# Patient Record
Sex: Male | Born: 1995 | Race: White | Hispanic: No | Marital: Single | Smoking: Never smoker
Health system: Southern US, Community
[De-identification: ages and names within clinical notes are randomized; demographics above are authoritative.]

## PROBLEM LIST (undated history)

## (undated) DIAGNOSIS — F840 Autistic disorder: Secondary | ICD-10-CM

## (undated) DIAGNOSIS — J302 Other seasonal allergic rhinitis: Secondary | ICD-10-CM

---
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
## 2014-07-14 ENCOUNTER — Ambulatory Visit: Payer: Self-pay | Admitting: Surgery

## 2015-05-10 ENCOUNTER — Encounter (HOSPITAL_COMMUNITY): Payer: Self-pay | Admitting: *Deleted

## 2015-05-10 ENCOUNTER — Emergency Department (HOSPITAL_COMMUNITY): Payer: Self-pay

## 2015-05-10 ENCOUNTER — Emergency Department (HOSPITAL_COMMUNITY)
Admission: EM | Admit: 2015-05-10 | Discharge: 2015-05-11 | Disposition: A | Discharge status: Home / Self Care | Payer: Self-pay | Attending: Emergency Medicine | Admitting: Emergency Medicine

## 2015-05-10 DIAGNOSIS — F840 Autistic disorder: Secondary | ICD-10-CM | POA: Insufficient documentation

## 2015-05-10 DIAGNOSIS — R002 Palpitations: Secondary | ICD-10-CM | POA: Insufficient documentation

## 2015-05-10 DIAGNOSIS — Z79899 Other long term (current) drug therapy: Secondary | ICD-10-CM | POA: Insufficient documentation

## 2015-05-10 HISTORY — DX: Gilbert syndrome: E80.4

## 2015-05-10 HISTORY — DX: Autistic disorder: F84.0

## 2015-05-10 HISTORY — DX: Other seasonal allergic rhinitis: J30.2

## 2015-05-10 LAB — BASIC METABOLIC PANEL
ANION GAP: 9 (ref 5–15)
BUN: 12 mg/dL (ref 6–20)
CO2: 27 mmol/L (ref 22–32)
Calcium: 9.3 mg/dL (ref 8.9–10.3)
Chloride: 106 mmol/L (ref 101–111)
Creatinine, Ser: 0.79 mg/dL (ref 0.61–1.24)
GFR calc Af Amer: >60 mL/min (ref >60)
GFR calc non Af Amer: >60 mL/min (ref >60)
GLUCOSE: 106 mg/dL — AB (ref 65–99)
POTASSIUM: 3.3 mmol/L — AB (ref 3.5–5.1)
Sodium: 142 mmol/L (ref 135–145)

## 2015-05-10 LAB — CBC WITH DIFFERENTIAL/PLATELET
BASOS ABS: 0 10*3/uL (ref 0.0–0.1)
Basophils Relative: 1 %
Eosinophils Absolute: 0.1 10*3/uL (ref 0.0–0.7)
Eosinophils Relative: 1 %
HEMATOCRIT: 39.8 % (ref 39.0–52.0)
Hemoglobin: 13.5 g/dL (ref 13.0–17.0)
LYMPHS PCT: 30 %
Lymphs Abs: 2 10*3/uL (ref 0.7–4.0)
MCH: 29.9 pg (ref 26.0–34.0)
MCHC: 33.9 g/dL (ref 30.0–36.0)
MCV: 88.2 fL (ref 78.0–100.0)
MONO ABS: 0.6 10*3/uL (ref 0.1–1.0)
Monocytes Relative: 10 %
NEUTROS ABS: 3.8 10*3/uL (ref 1.7–7.7)
Neutrophils Relative %: 58 %
Platelets: 232 10*3/uL (ref 150–400)
RBC: 4.51 MIL/uL (ref 4.22–5.81)
RDW: 12.5 % (ref 11.5–15.5)
WBC: 6.5 10*3/uL (ref 4.0–10.5)

## 2015-05-10 LAB — I-STAT TROPONIN, ED: Troponin i, poc: 0 ng/mL (ref 0.00–0.08)

## 2015-05-10 IMAGING — CR DG CHEST 2V
2 series · 2 of 2 positions shown · non-contrast
Comparison: [DATE] chest radiograph.

CLINICAL DATA: Chest pain

EXAM:
CHEST  2 VIEW

[chest pa]
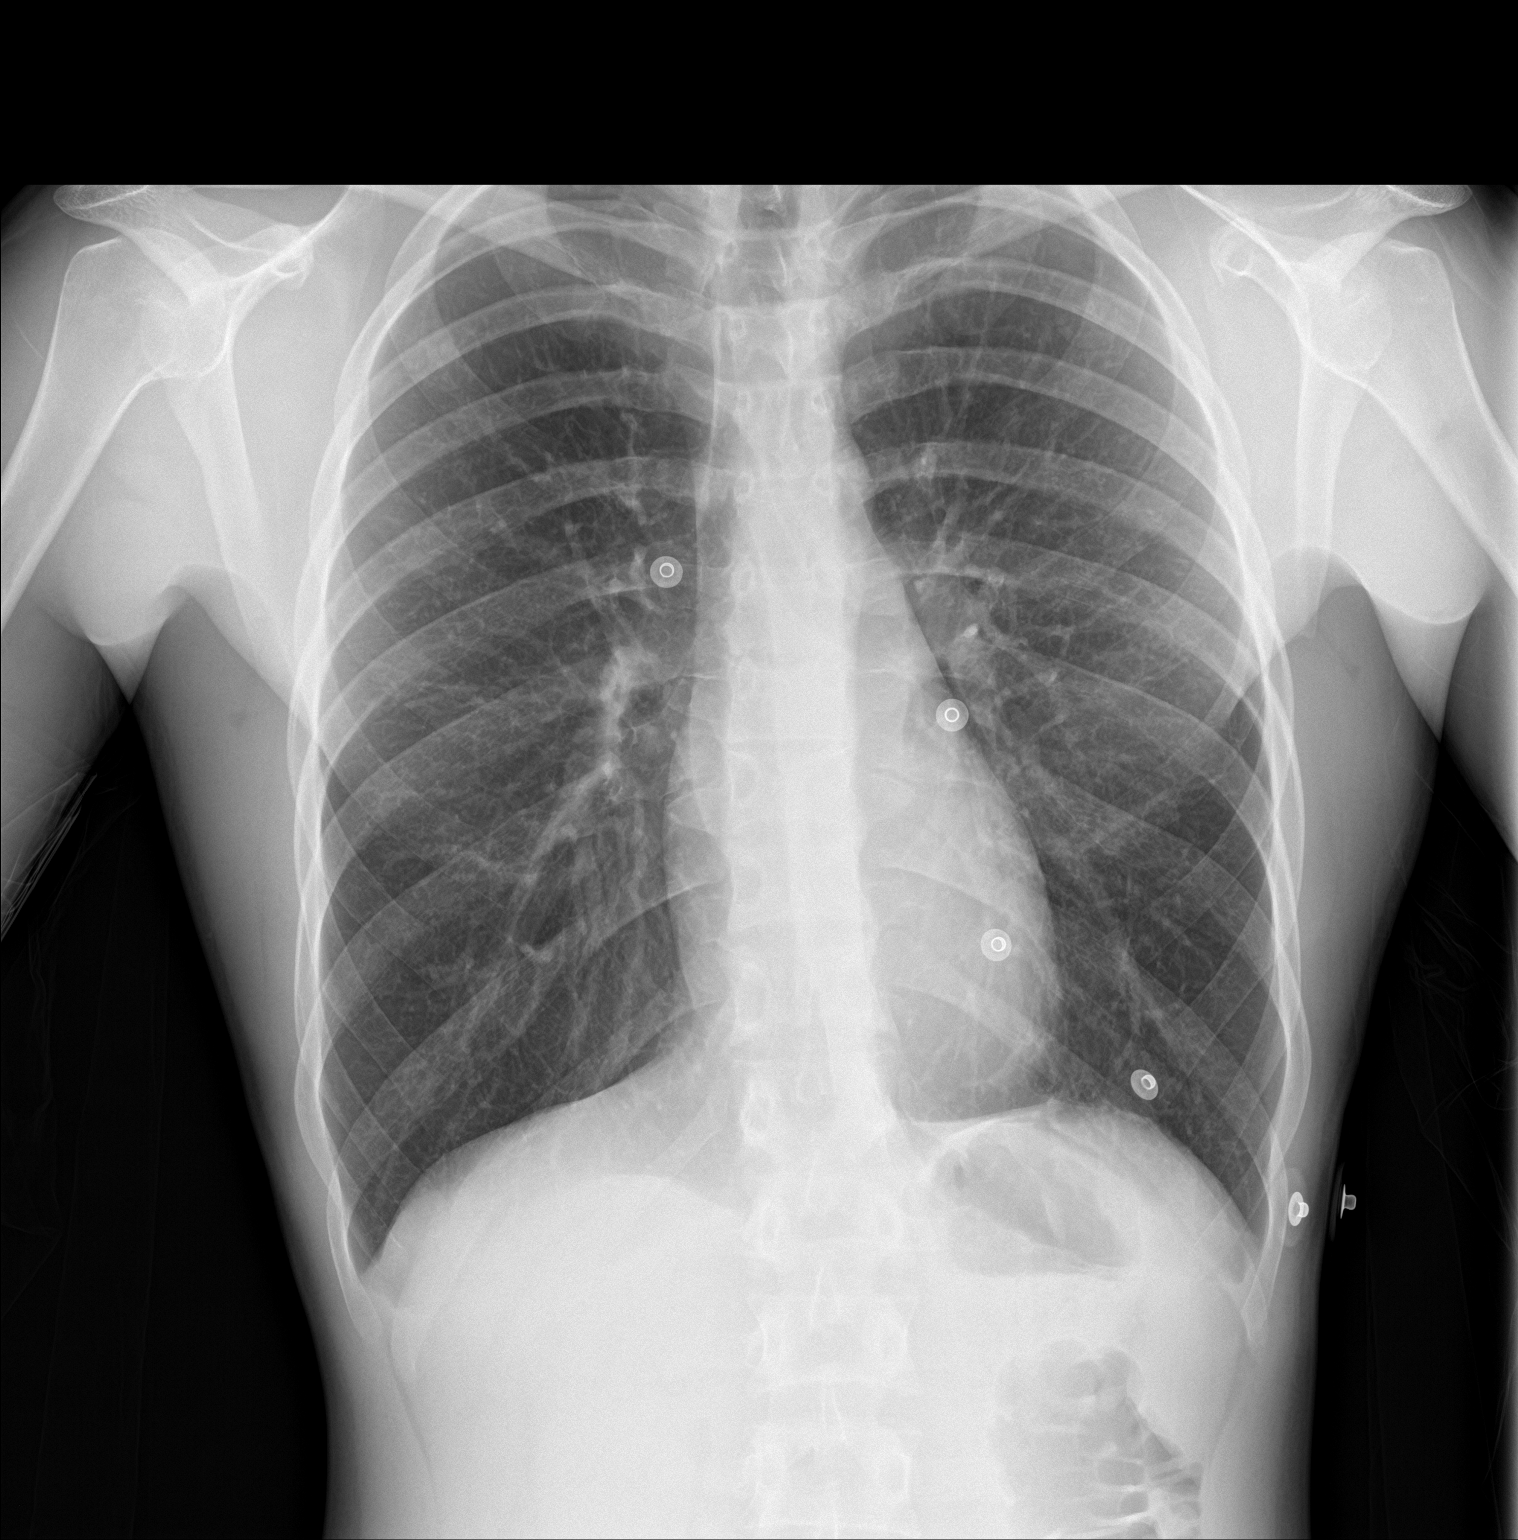

[chest lat]
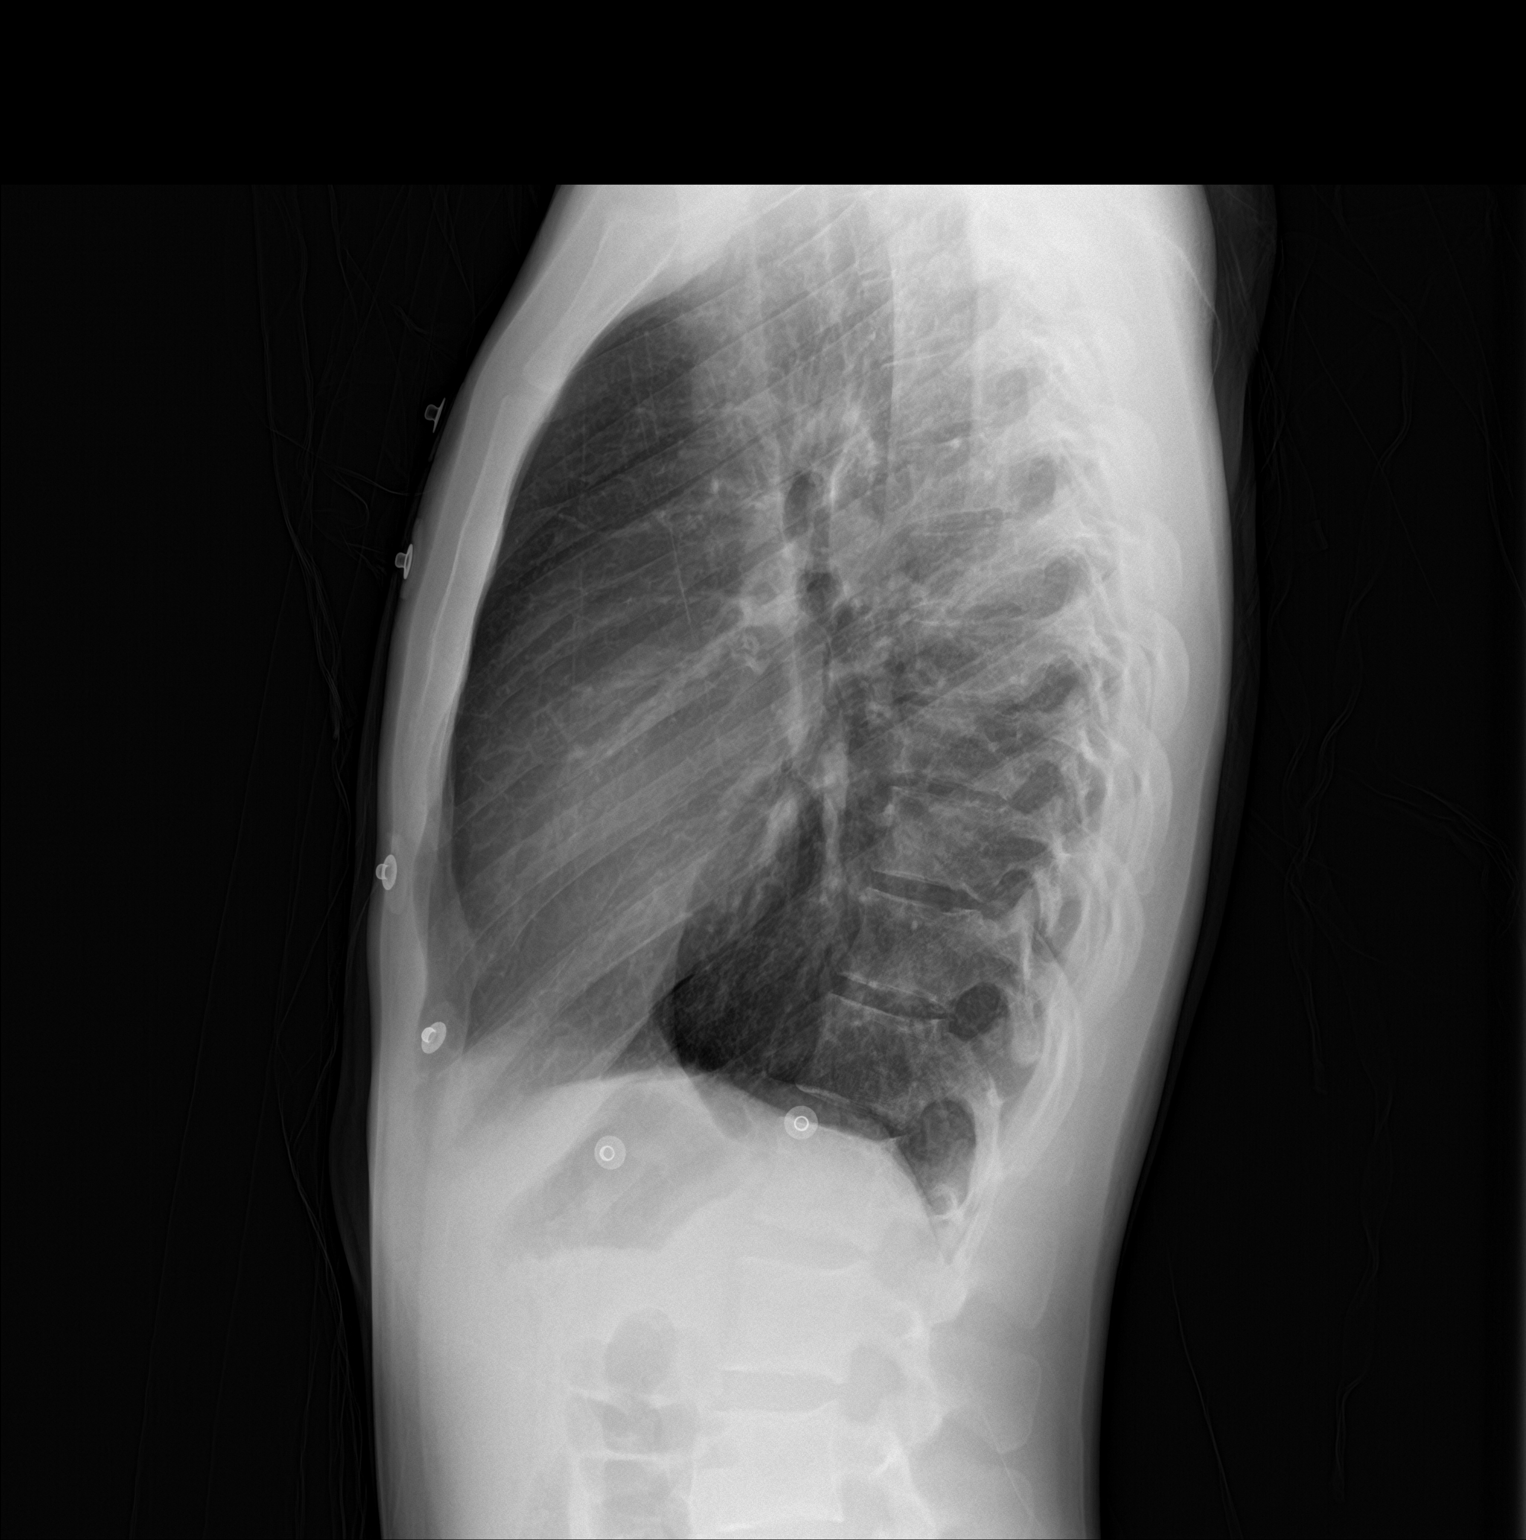

[2 of 2 positions shown; findings below may reference images not displayed]

FINDINGS: Stable cardiomediastinal silhouette with normal heart size. No
pneumothorax. No pleural effusion. Lungs appear clear, with no acute
consolidative airspace disease and no pulmonary edema.
IMPRESSION: No active cardiopulmonary disease.

## 2015-05-10 IMAGING — CT CT CTA ABD/PEL W/CM AND/OR W/O CM
1 of 10 series · 1 of 36 positions shown · IV contrast (Iohexol (Omnipaque 350))
Comparison: CT of the abdomen and pelvis performed [DATE], and
chest radiograph earlier today at [DATE] p.m.

CLINICAL DATA: Acute onset of central chest pain. Tachycardia and
left arm numbness. Initial encounter.

EXAM:
CT ANGIOGRAPHY CHEST, ABDOMEN AND PELVIS
TECHNIQUE: Multidetector CT imaging through the chest, abdomen and pelvis was
performed using the standard protocol during bolus administration of
intravenous contrast. Multiplanar reconstructed images and MIPs were
obtained and reviewed to evaluate the vascular anatomy.
CONTRAST:  100mL OMNIPAQUE IOHEXOL 350 MG/ML SOLN

[Series 300: locator · axial · 0.68mm/px · 1 of 1 slices shown]
[im 1/1  lung]
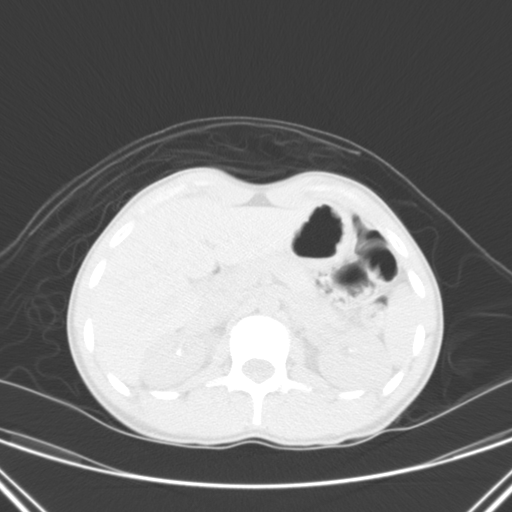

[1 of 36 positions shown; findings below may reference images not displayed]

FINDINGS: CTA CHEST FINDINGS

There is no evidence of aortic dissection. There is no evidence of
aneurysmal dilatation. The great vessels are grossly unremarkable in
appearance.

There is no evidence of pulmonary embolus.

The lungs are clear bilaterally. There is no evidence of significant
focal consolidation, pleural effusion or pneumothorax. No masses are
identified; no abnormal focal contrast enhancement is seen.

The mediastinum is unremarkable in appearance. No mediastinal
lymphadenopathy is seen. No pericardial effusion is identified. The
great vessels are grossly unremarkable in appearance. No axillary
lymphadenopathy is seen. The visualized portions of the thyroid
gland are unremarkable in appearance.

No acute osseous abnormalities are seen.

Review of the MIP images confirms the above findings.

CTA ABDOMEN AND PELVIS FINDINGS

There is no evidence of aortic dissection. There is no evidence of
aneurysmal dilatation. No calcific atherosclerotic disease is seen.

The liver and spleen are unremarkable in appearance. The gallbladder
is within normal limits. The pancreas and adrenal glands are
unremarkable.

The kidneys are unremarkable in appearance. There is no evidence of
hydronephrosis. No renal or ureteral stones are seen. No perinephric
stranding is appreciated.

No free fluid is identified. The small bowel is unremarkable in
appearance. The stomach is within normal limits. No acute vascular
abnormalities are seen.

The appendix is normal in caliber, without evidence of appendicitis.
The colon is unremarkable in appearance.

The bladder is moderately distended and grossly unremarkable. The
prostate remains normal in size. No inguinal lymphadenopathy is
seen.

No acute osseous abnormalities are identified.

Review of the MIP images confirms the above findings.
IMPRESSION: 1. No evidence of aortic dissection. No evidence of aneurysmal
dilatation. No calcific atherosclerotic disease seen.
2. No evidence of pulmonary embolus.
3. Lungs clear bilaterally.
4. No acute abnormality seen within the abdomen or pelvis.

## 2015-05-10 MED ORDER — IOHEXOL 350 MG/ML SOLN
100.0000 mL | Freq: Once | INTRAVENOUS | Status: AC | PRN
  Administered 2015-05-10: 100 mL via INTRAVENOUS

## 2015-05-10 NOTE — ED Notes (Signed)
[facility] getting [facility] chest pain. States that it was sharp and felt like his heart was racing, his left arm also went numb during the event. States he has had chest pain in the past that he usually just "stretches it out and it goes away" but today it didn't go away. Pt told his mom who checked his heart rate and said it was in the 140's so she called EMS. EMS reports NSR upon their arrival with no other symptoms. Pt states that his mom gave him 5 baby aspirins and the pain and numbness went away.

## 2015-05-10 NOTE — ED Provider Notes (Signed)
CSN: [id number]     Arrival date & time 05/10/15  2000 History   First MD Initiated Contact with Patient 05/10/15 2001     Chief Complaint  Patient presents with  . Chest Pain     (Consider location/radiation/quality/duration/timing/severity/associated sxs/prior Treatment) HPI   Patient with PMH of Gilbert's disease, Autism, Seasonal allergies. He has been having on again, off again, chest pains for the past few months. Stretching typically makes the pains go away. While he was in [facility] today he developed the chest pain that lasted approximately 15 minutes. He went home to tell his mother who gave him 5 baby aspirins and checked his pulse and said its 140. Per EMS, on arrival he was in normal sinus rhythm. Pt had associated numbness to his left arm. Denies hx of surgeries, heart disease, family heart disease. Denies any recent trauma or fall. Denies SOB, coughing, ear pain, sore throat, nasal congestion.  10: 15 pm- Mom presented late. She offers more information. She is concerned because his older brother at the patients age had severe CP like [place] and was diagnosed with aneurysm to his aortic notch which is now close to 5 cm. She is concerned that this son has a similar condition. She reports at home he became pale, with purple/blue around his eyes, and diaphoretic which she admits all resolved by the time EMS arrived on scene.  Past Medical History  Diagnosis Date  . Gilbert's disease   . Autism   . Seasonal allergies    History reviewed. No pertinent past surgical history. No family history on file. Social History  Substance Use Topics  . Smoking status: Never Smoker   . Smokeless tobacco: Current User    Types: Chew     Comment: 1 - 2 times weekly  . Alcohol Use: No    Review of Systems  Review of Systems All other systems negative except as documented in the HPI. All pertinent positives and negatives as reviewed in the HPI.  Allergies  Review of patient's allergies  indicates no known allergies.  Home Medications   Prior to Admission medications   Medication Sig Start Date End Date Taking? Authorizing Provider  Ascorbic Acid (VITAMIN C PO) Take 1 tablet by mouth daily.    Yes Historical Provider, MD  esomeprazole (NEXIUM) 40 MG capsule Take 40 mg by mouth daily at 12 noon.   Yes Historical Provider, MD  Multiple Vitamin (MULTIVITAMIN WITH MINERALS) TABS tablet Take 1 tablet by mouth daily.   Yes Historical Provider, MD   BP 127/73 mmHg  Pulse 75  Temp(Src) 98.7 F (37.1 C) (Oral)  Resp 19  SpO2 99% Physical Exam  Constitutional: He appears well-developed and well-nourished. No distress.  HENT:  Head: Normocephalic and atraumatic.  Right Ear: Tympanic membrane and ear canal normal.  Left Ear: Tympanic membrane and ear canal normal.  Nose: Nose normal.  Mouth/Throat: Uvula is midline, oropharynx is clear and moist and mucous membranes are normal.  Eyes: Pupils are equal, round, and reactive to light.  Neck: Normal range of motion. Neck supple.  Cardiovascular: Normal rate and regular rhythm.   Pulmonary/Chest: Effort normal.  Abdominal: Soft.  No signs of abdominal distention  Musculoskeletal:  No LE swelling  Neurological: He is alert.  Acting at baseline  Skin: Skin is warm and dry. No rash noted.  Nursing note and vitals reviewed.   ED Course  Procedures (including critical care time) Labs Review Labs Reviewed  BASIC METABOLIC PANEL -  Abnormal; Notable for the following:    Potassium 3.3 (*)    Glucose, Bld 106 (*)    All other components within normal limits  CBC WITH DIFFERENTIAL/PLATELET  Rosezena Sensor, ED    Imaging Review Dg Chest 2 View  05/10/2015  CLINICAL DATA:  Chest pain EXAM: CHEST  2 VIEW COMPARISON:  01/10/2010 chest radiograph. FINDINGS: Stable cardiomediastinal silhouette with normal heart size. No pneumothorax. No pleural effusion. Lungs appear clear, with no acute consolidative airspace disease and no  pulmonary edema. IMPRESSION: No active cardiopulmonary disease. Electronically Signed   By: Delbert Phenix M.D.   On: 05/10/2015 21:00   Ct Angio Chest Aorta W/cm &/or Wo/cm  05/11/2015  CLINICAL DATA:  Acute onset of central chest pain. Tachycardia and left arm numbness. Initial encounter. EXAM: CT ANGIOGRAPHY CHEST, ABDOMEN AND PELVIS TECHNIQUE: Multidetector CT imaging through the chest, abdomen and pelvis was performed using the standard protocol during bolus administration of intravenous contrast. Multiplanar reconstructed images and MIPs were obtained and reviewed to evaluate the vascular anatomy. CONTRAST:  OMNIPAQUE IOHEXOL 350 MG/ML SOLN COMPARISON:  CT of the abdomen and pelvis performed 08/10/2006, and chest radiograph earlier today at 8:39 p.m. FINDINGS: CTA CHEST FINDINGS There is no evidence of aortic dissection. There is no evidence of aneurysmal dilatation. The great vessels are grossly unremarkable in appearance. There is no evidence of pulmonary embolus. The lungs are clear bilaterally. There is no evidence of significant focal consolidation, pleural effusion or pneumothorax. No masses are identified; no abnormal focal contrast enhancement is seen. The mediastinum is unremarkable in appearance. No mediastinal lymphadenopathy is seen. No pericardial effusion is identified. The great vessels are grossly unremarkable in appearance. No axillary lymphadenopathy is seen. The visualized portions of the thyroid gland are unremarkable in appearance. No acute osseous abnormalities are seen. Review of the MIP images confirms the above findings. CTA ABDOMEN AND PELVIS FINDINGS There is no evidence of aortic dissection. There is no evidence of aneurysmal dilatation. No calcific atherosclerotic disease is seen. The liver and spleen are unremarkable in appearance. The gallbladder is within normal limits. The pancreas and adrenal glands are unremarkable. The kidneys are unremarkable in appearance. There is  no evidence of hydronephrosis. No renal or ureteral stones are seen. No perinephric stranding is appreciated. No free fluid is identified. The small bowel is unremarkable in appearance. The stomach is within normal limits. No acute vascular abnormalities are seen. The appendix is normal in caliber, without evidence of appendicitis. The colon is unremarkable in appearance. The bladder is moderately distended and grossly unremarkable. The prostate remains normal in size. No inguinal lymphadenopathy is seen. No acute osseous abnormalities are identified. Review of the MIP images confirms the above findings. IMPRESSION: 1. No evidence of aortic dissection. No evidence of aneurysmal dilatation. No calcific atherosclerotic disease seen. 2. No evidence of pulmonary embolus. 3. Lungs clear bilaterally. 4. No acute abnormality seen within the abdomen or pelvis. Electronically Signed   By: Roanna Raider M.D.   On: 05/11/2015 00:35   Ct Cta Abd/pel W/cm &/or W/o Cm  05/11/2015  CLINICAL DATA:  Acute onset of central chest pain. Tachycardia and left arm numbness. Initial encounter. EXAM: CT ANGIOGRAPHY CHEST, ABDOMEN AND PELVIS TECHNIQUE: Multidetector CT imaging through the chest, abdomen and pelvis was performed using the standard protocol during bolus administration of intravenous contrast. Multiplanar reconstructed images and MIPs were obtained and reviewed to evaluate the vascular anatomy. CONTRAST:  OMNIPAQUE IOHEXOL 350 MG/ML SOLN COMPARISON:  CT of  the abdomen and pelvis performed 08/10/2006, and chest radiograph earlier today at 8:39 p.m. FINDINGS: CTA CHEST FINDINGS There is no evidence of aortic dissection. There is no evidence of aneurysmal dilatation. The great vessels are grossly unremarkable in appearance. There is no evidence of pulmonary embolus. The lungs are clear bilaterally. There is no evidence of significant focal consolidation, pleural effusion or pneumothorax. No masses are identified; no  abnormal focal contrast enhancement is seen. The mediastinum is unremarkable in appearance. No mediastinal lymphadenopathy is seen. No pericardial effusion is identified. The great vessels are grossly unremarkable in appearance. No axillary lymphadenopathy is seen. The visualized portions of the thyroid gland are unremarkable in appearance. No acute osseous abnormalities are seen. Review of the MIP images confirms the above findings. CTA ABDOMEN AND PELVIS FINDINGS There is no evidence of aortic dissection. There is no evidence of aneurysmal dilatation. No calcific atherosclerotic disease is seen. The liver and spleen are unremarkable in appearance. The gallbladder is within normal limits. The pancreas and adrenal glands are unremarkable. The kidneys are unremarkable in appearance. There is no evidence of hydronephrosis. No renal or ureteral stones are seen. No perinephric stranding is appreciated. No free fluid is identified. The small bowel is unremarkable in appearance. The stomach is within normal limits. No acute vascular abnormalities are seen. The appendix is normal in caliber, without evidence of appendicitis. The colon is unremarkable in appearance. The bladder is moderately distended and grossly unremarkable. The prostate remains normal in size. No inguinal lymphadenopathy is seen. No acute osseous abnormalities are identified. Review of the MIP images confirms the above findings. IMPRESSION: 1. No evidence of aortic dissection. No evidence of aneurysmal dilatation. No calcific atherosclerotic disease seen. 2. No evidence of pulmonary embolus. 3. Lungs clear bilaterally. 4. No acute abnormality seen within the abdomen or pelvis. Electronically Signed   By: Roanna RaiderJeffery  Chang M.D.   On: 05/11/2015 00:35   I have personally reviewed and evaluated these images and lab results as part of my medical decision-making.   EKG Interpretation   Date/Time:  Tuesday May 10 2015 20:02:16 EDT Ventricular Rate:   83 PR Interval:  141 QRS Duration: 97 QT Interval:  369 QTC Calculation: 434 R Axis:   76 Text Interpretation:  Sinus rhythm RSR' in V1 or V2, right VCD or RVH ST  elevation, consider inferior injury No old tracing to compare Confirmed by  [facility]  MD, ELLIOTT [phone]) on 05/10/2015 10:00:20 PM      MDM   Final diagnoses:  Palpitations    Shouldn't likely had some sort of tachycardia arrhythmia that was self-limiting. It was not witnessed during his stay in the emergency department. Due to family history of aneurysm, the mom requested this be evaluated due to concern for genetic predisposition. The patient's workup, including CT angiogram chest and abdomen, chest x-ray, troponin and blood work all returned unremarkable. He'll be referred to cardiology for further evaluation but at this time the patient is safe for discharge and has been pain free and asymptomatic during his stay in the ER   Marlon Peliffany Adrina Armijo, PA-C 05/11/15 0046  Mancel BaleElliott Wentz, MD 05/12/15 [phone]

## 2015-05-11 NOTE — Discharge Instructions (Signed)
Palpitations  A palpitation is the feeling that your heartbeat is irregular or is faster than normal. It may feel like your heart is fluttering or skipping a beat. Palpitations are usually not a serious problem. However, in some cases, you may need further medical evaluation.  CAUSES   Palpitations can be caused by:  · Smoking.  · Caffeine or other stimulants, such as diet pills or energy drinks.  · Alcohol.  · Stress and anxiety.  · Strenuous physical activity.  · Fatigue.  · Certain medicines.  · Heart disease, especially if you have a history of irregular heart rhythms (arrhythmias), such as atrial fibrillation, atrial flutter, or supraventricular tachycardia.  · An improperly working pacemaker or defibrillator.  DIAGNOSIS   To find the cause of your palpitations, your health care provider will take your medical history and perform a physical exam. Your health care provider may also have you take a test called an ambulatory electrocardiogram (ECG). An ECG records your heartbeat patterns over a 24-hour period. You may also have other tests, such as:  · Transthoracic echocardiogram (TTE). During echocardiography, sound waves are used to evaluate how blood flows through your heart.  · Transesophageal echocardiogram (TEE).  · Cardiac monitoring. This allows your health care provider to monitor your heart rate and rhythm in real time.  · Holter monitor. This is a portable device that records your heartbeat and can help diagnose heart arrhythmias. It allows your health care provider to track your heart activity for several days, if needed.  · Stress tests by exercise or by giving medicine that makes the heart beat faster.  TREATMENT   Treatment of palpitations depends on the cause of your symptoms and can vary greatly. Most cases of palpitations do not require any treatment other than time, relaxation, and monitoring your symptoms. Other causes, such as atrial fibrillation, atrial flutter, or supraventricular  tachycardia, usually require further treatment.  HOME CARE INSTRUCTIONS   · Avoid:    Caffeinated coffee, tea, soft drinks, diet pills, and energy drinks.    Chocolate.    Alcohol.  · Stop smoking if you smoke.  · Reduce your stress and anxiety. Things that can help you relax include:    A method of controlling things in your body, such as your heartbeats, with your mind (biofeedback).    Yoga.    Meditation.    Physical activity such as swimming, jogging, or walking.  · Get plenty of rest and sleep.  SEEK MEDICAL CARE IF:   · You continue to have a fast or irregular heartbeat beyond 24 hours.  · Your palpitations occur more often.  SEEK IMMEDIATE MEDICAL CARE IF:  · You have chest pain or shortness of breath.  · You have a severe headache.  · You feel dizzy or you faint.  MAKE SURE YOU:  · Understand these instructions.  · Will watch your condition.  · Will get help right away if you are not doing well or get worse.     This information is not intended to replace advice given to you by your health care provider. Make sure you discuss any questions you have with your health care provider.     Document Released: 01/27/2000 Document Revised: 02/03/2013 Document Reviewed: 03/30/2011  Elsevier Interactive Patient Education ©2016 [employer]      Holter Monitoring  A Holter monitor is a small device that is used to detect abnormal heart rhythms. It clips to your clothing and is connected by wires   to flat, sticky disks (electrodes) that attach to your chest. It is worn continuously for 24-48 hours.  HOME CARE INSTRUCTIONS  · Wear your Holter monitor at all times, even while exercising and sleeping, for as long as directed by your health care provider.  · Make sure that the Holter monitor is safely clipped to your clothing or close to your body as recommended by your health care provider.  · Do not get the monitor or wires wet.  · Do not put body lotion or moisturizer on your chest.  · Keep your skin clean.  · Keep a  diary of your daily activities, such as walking and doing chores. If you feel that your heartbeat is abnormal or that your heart is fluttering or skipping a beat:    Record what you are doing when it happens.    Record what time of day the symptoms occur.  · Return your Holter monitor as directed by your health care provider.  · Keep all follow-up visits as directed by your health care provider. This is important.  SEEK IMMEDIATE MEDICAL CARE IF:  · You feel lightheaded or you faint.  · You have trouble breathing.  · You feel pain in your chest, upper arm, or jaw.  · You feel sick to your stomach and your skin is pale, cool, or damp.  · You heartbeat feels unusual or abnormal.     This information is not intended to replace advice given to you by your health care provider. Make sure you discuss any questions you have with your health care provider.     Document Released: 10/28/2003 Document Revised: 02/19/2014 Document Reviewed: 09/07/2013  Elsevier Interactive Patient Education ©2016 [employer]

## 2015-12-10 ENCOUNTER — Encounter: Payer: Self-pay | Admitting: Emergency Medicine

## 2015-12-10 ENCOUNTER — Emergency Department
Admission: EM | Admit: 2015-12-10 | Discharge: 2015-12-10 | Discharge status: Against Medical Advice | Payer: 59 | Attending: Emergency Medicine | Admitting: Emergency Medicine

## 2015-12-10 DIAGNOSIS — F17220 Nicotine dependence, chewing tobacco, uncomplicated: Secondary | ICD-10-CM | POA: Diagnosis not present

## 2015-12-10 DIAGNOSIS — F840 Autistic disorder: Secondary | ICD-10-CM | POA: Diagnosis not present

## 2015-12-10 DIAGNOSIS — S6710XA Crushing injury of unspecified finger(s), initial encounter: Secondary | ICD-10-CM

## 2015-12-10 DIAGNOSIS — S67192A Crushing injury of right middle finger, initial encounter: Secondary | ICD-10-CM | POA: Diagnosis not present

## 2015-12-10 DIAGNOSIS — Y999 Unspecified external cause status: Secondary | ICD-10-CM | POA: Insufficient documentation

## 2015-12-10 DIAGNOSIS — Y929 Unspecified place or not applicable: Secondary | ICD-10-CM | POA: Diagnosis not present

## 2015-12-10 DIAGNOSIS — X58XXXA Exposure to other specified factors, initial encounter: Secondary | ICD-10-CM | POA: Insufficient documentation

## 2015-12-10 DIAGNOSIS — Y939 Activity, unspecified: Secondary | ICD-10-CM | POA: Insufficient documentation

## 2015-12-10 DIAGNOSIS — S6991XA Unspecified injury of right wrist, hand and finger(s), initial encounter: Secondary | ICD-10-CM | POA: Diagnosis present

## 2015-12-10 NOTE — ED Notes (Signed)
PA going in to see pt   But he does not want to wait    staes he needs to go

## 2015-12-10 NOTE — ED Notes (Signed)
Explained the wait   Family at bedside 

## 2015-12-10 NOTE — ED Triage Notes (Signed)
States he got his right 3 rd digit caught   Bruising and swelling noted at nail bed

## 2015-12-10 NOTE — ED Provider Notes (Signed)
[facility] Emergency Department Provider Note  ____________________________________________  Time seen: Approximately 6:40 PM  I have reviewed the triage vital signs and the nursing notes.   HISTORY  Chief Complaint Finger Injury    HPI Samuel Hancock is a 20 y.o. male who presented to the emergency room with injury to his third digit. As I was walking to his room he decided to leave before being seen and evaluated. He was encouraged to stay several times, but still left before being evaluated.   Past Medical History:  Diagnosis Date  . Autism   . Gilbert's disease   . Seasonal allergies     There are no active problems to display for this patient.   History reviewed. No pertinent surgical history.  Current Outpatient Rx  . Order #: [id number] Class: Historical Med  . Order #: [id number] Class: Historical Med  . Order #: [id number] Class: Historical Med    Allergies Review of patient's allergies indicates no known allergies.  No family history on file.  Social History Social History  Substance Use Topics  . Smoking status: Never Smoker  . Smokeless tobacco: Current User    Types: Chew     Comment: 1 - 2 times weekly  . Alcohol use No    Review of Systems  ____________________________________________   PHYSICAL EXAM:  VITAL SIGNS: ED Triage Vitals  Enc Vitals Group     BP 12/10/15 1728 123/76     Pulse Rate 12/10/15 1728 73     Resp 12/10/15 1728 20     Temp 12/10/15 1728 97.9 F (36.6 C)     Temp Source 12/10/15 1728 Oral     SpO2 12/10/15 1728 100 %     Weight 12/10/15 1732 121 lb (54.9 kg)     Height 12/10/15 1732 5\' 6"  (1.676 m)     Head Circumference --      Peak Flow --      Pain Score 12/10/15 1727 5     Pain Loc --      Pain Edu? --      Excl. in GC? --    ____________________________________________   LABS (all labs ordered are listed, but only abnormal results are displayed)  Labs Reviewed - No data to  display ____________________________________________  EKG   ____________________________________________  RADIOLOGY   ____________________________________________   PROCEDURES  Procedure(s) performed: None  Critical Care performed: No  ____________________________________________   INITIAL IMPRESSION / ASSESSMENT AND PLAN / ED COURSE   20 year old who came to the emergency room for injury to his third digit but left before evaluation by the provider. No x-rays were performed ____________________________________________   FINAL CLINICAL IMPRESSION(S) / ED DIAGNOSES  Final diagnoses:  Crushing injury of finger, initial encounter      Ignacia BayleyRobert Valor [place], PA-C 12/10/15 1843    Minna AntisKevin Paduchowski, MD 12/11/15 0000

## 2017-06-18 ENCOUNTER — Emergency Department (HOSPITAL_COMMUNITY): Payer: 59

## 2017-06-18 ENCOUNTER — Emergency Department (HOSPITAL_COMMUNITY)
Admission: EM | Admit: 2017-06-18 | Discharge: 2017-06-18 | Disposition: A | Discharge status: Home / Self Care | Payer: 59 | Attending: Emergency Medicine | Admitting: Emergency Medicine

## 2017-06-18 ENCOUNTER — Encounter (HOSPITAL_COMMUNITY): Payer: Self-pay | Admitting: Emergency Medicine

## 2017-06-18 DIAGNOSIS — Y939 Activity, unspecified: Secondary | ICD-10-CM | POA: Diagnosis not present

## 2017-06-18 DIAGNOSIS — S0990XA Unspecified injury of head, initial encounter: Secondary | ICD-10-CM

## 2017-06-18 DIAGNOSIS — M542 Cervicalgia: Secondary | ICD-10-CM | POA: Diagnosis not present

## 2017-06-18 DIAGNOSIS — Y929 Unspecified place or not applicable: Secondary | ICD-10-CM | POA: Diagnosis not present

## 2017-06-18 DIAGNOSIS — Y998 Other external cause status: Secondary | ICD-10-CM | POA: Diagnosis not present

## 2017-06-18 DIAGNOSIS — F840 Autistic disorder: Secondary | ICD-10-CM | POA: Diagnosis not present

## 2017-06-18 DIAGNOSIS — Z79899 Other long term (current) drug therapy: Secondary | ICD-10-CM | POA: Diagnosis not present

## 2017-06-18 LAB — I-STAT CHEM 8, ED
BUN: 15 mg/dL (ref 6–20)
CREATININE: 0.9 mg/dL (ref 0.61–1.24)
Calcium, Ion: 1.16 mmol/L (ref 1.15–1.40)
Chloride: 101 mmol/L (ref 101–111)
GLUCOSE: 87 mg/dL (ref 65–99)
HCT: 46 % (ref 39.0–52.0)
HEMOGLOBIN: 15.6 g/dL (ref 13.0–17.0)
Potassium: 4.2 mmol/L (ref 3.5–5.1)
Sodium: 141 mmol/L (ref 135–145)
TCO2: 28 mmol/L (ref 22–32)
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
## 2017-06-18 NOTE — Discharge Instructions (Addendum)
Please drink plenty of fluids You blood counts and kidney function were normal today Take Tylenol or Ibuprofen for pain Follow up with [facility] or [facility] neurology

## 2017-06-18 NOTE — ED Notes (Signed)
Signature pad not working. 
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
## 2017-09-02 ENCOUNTER — Ambulatory Visit: Payer: 59 | Admitting: Neurology

## 2017-09-02 ENCOUNTER — Encounter

## 2017-09-02 ENCOUNTER — Telehealth: Payer: Self-pay | Admitting: *Deleted

## 2017-09-02 NOTE — Telephone Encounter (Signed)
No showed new patient appointment. 

## 2017-09-04 ENCOUNTER — Encounter: Payer: Self-pay | Admitting: Neurology

## 2020-04-23 ENCOUNTER — Encounter: Payer: Self-pay | Admitting: Emergency Medicine

## 2020-04-23 ENCOUNTER — Other Ambulatory Visit: Payer: Self-pay

## 2020-04-23 ENCOUNTER — Ambulatory Visit
Admission: EM | Admit: 2020-04-23 | Discharge: 2020-04-23 | Disposition: A | Discharge status: Home / Self Care | Payer: Managed Care, Other (non HMO) | Attending: Family Medicine | Admitting: Family Medicine

## 2020-04-23 ENCOUNTER — Ambulatory Visit (INDEPENDENT_AMBULATORY_CARE_PROVIDER_SITE_OTHER): Payer: Managed Care, Other (non HMO)

## 2020-04-23 DIAGNOSIS — M25562 Pain in left knee: Secondary | ICD-10-CM

## 2020-04-23 DIAGNOSIS — M545 Low back pain, unspecified: Secondary | ICD-10-CM

## 2020-04-23 DIAGNOSIS — M25561 Pain in right knee: Secondary | ICD-10-CM

## 2020-04-23 DIAGNOSIS — S39012A Strain of muscle, fascia and tendon of lower back, initial encounter: Secondary | ICD-10-CM
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
## 2020-04-23 NOTE — Discharge Instructions (Addendum)
You may take over the counter ibuprofen 800mg  three times daily with food.  HOME CARE INSTRUCTIONS: For many people, back pain returns. Since low back pain is rarely dangerous, it is often a condition that people can learn to manage on their own. Please remain active. It is stressful on the back to sit or stand in one place. Do not sit, drive, or stand in one place for more than 30 minutes at a time. Take short walks on level surfaces as soon as pain allows. Try to increase the length of time you walk each day. Do not stay in bed. Resting more than 1 or 2 days can delay your recovery. Do not avoid exercise or work. Your body is made to move. It is not dangerous to be active, even though your back may hurt. Your back will likely heal faster if you return to being active before your pain is gone. Over-the-counter medicines to reduce pain and inflammation are often the most helpful.  SEEK MEDICAL CARE IF: You have pain that is not relieved with rest or medicine. You have pain that does not improve in 1 week. You have new symptoms. You are generally not feeling well.  SEEK IMMEDIATE MEDICAL CARE IF: You have pain that radiates from your back into your legs. You develop new bowel or bladder control problems. You have unusual weakness or numbness in your arms or legs. You develop nausea or vomiting. You develop abdominal pain. You feel faint.
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
## 2020-06-16 ENCOUNTER — Other Ambulatory Visit: Payer: Self-pay | Admitting: Family

## 2020-06-16 DIAGNOSIS — M25561 Pain in right knee: Secondary | ICD-10-CM

## 2020-06-16 DIAGNOSIS — M25562 Pain in left knee: Secondary | ICD-10-CM

## 2020-06-19 ENCOUNTER — Inpatient Hospital Stay: Admission: RE | Admit: 2020-06-19 | Payer: Managed Care, Other (non HMO) | Source: Ambulatory Visit

## 2020-06-20 ENCOUNTER — Other Ambulatory Visit: Payer: Self-pay | Admitting: Family

## 2020-06-20 DIAGNOSIS — M25561 Pain in right knee: Secondary | ICD-10-CM

## 2020-06-20 DIAGNOSIS — M25562 Pain in left knee: Secondary | ICD-10-CM

## 2020-06-25 ENCOUNTER — Ambulatory Visit
Admission: RE | Admit: 2020-06-25 | Discharge: 2020-06-25 | Disposition: A | Discharge status: Home / Self Care | Payer: Managed Care, Other (non HMO) | Source: Ambulatory Visit | Attending: Family | Admitting: Family

## 2020-06-25 ENCOUNTER — Other Ambulatory Visit: Payer: Self-pay

## 2020-06-25 DIAGNOSIS — M25561 Pain in right knee: Secondary | ICD-10-CM

## 2020-06-25 IMAGING — MR MR LUMBAR SPINE W/O CM
4 of 5 series · 27 of 48 positions shown · non-contrast
Comparison: Prior radiograph from [DATE].

CLINICAL DATA: Initial evaluation for mid and lower back pain for 2
months, stiffness.

EXAM:
MRI LUMBAR SPINE WITHOUT CONTRAST
TECHNIQUE: Multiplanar, multisequence MR imaging of the lumbar spine was
performed. No intravenous contrast was administered.

[Series 3: T2 · sagittal · 4.0mm · 1.09mm/px · 6 of 17 slices shown (1 of 2)]
[im 1/17]
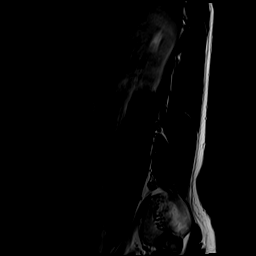
[im 4/17]
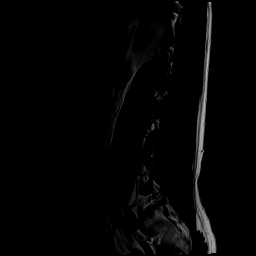
[im 7/17]
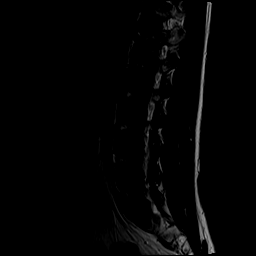
[im 10/17]
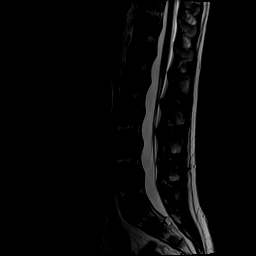
[im 13/17]
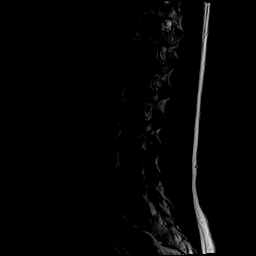
[im 17/17]
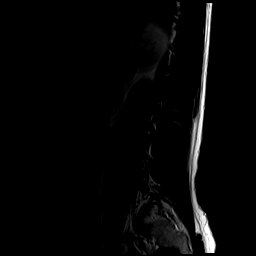

[Series 5: T1 · sagittal · 4.0mm · 1.09mm/px · 6 of 17 slices shown (1 of 2)]
[im 1/17]
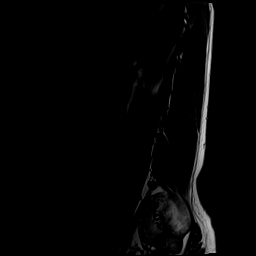
[im 4/17]
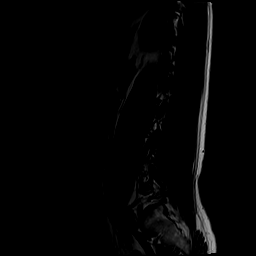
[im 7/17]
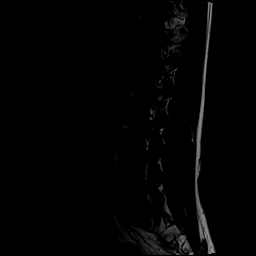
[im 10/17]
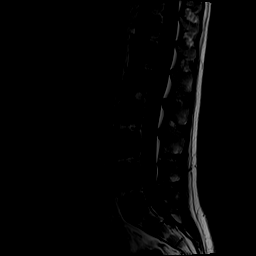
[im 13/17]
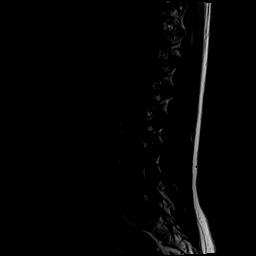
[im 17/17]
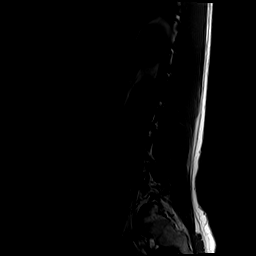

[Series 6: T2 · axial · 4.0mm · 0.39mm/px · z∈[-66,+153]mm · 9 of 43 slices shown (2 of 2)]
[im 1/43]
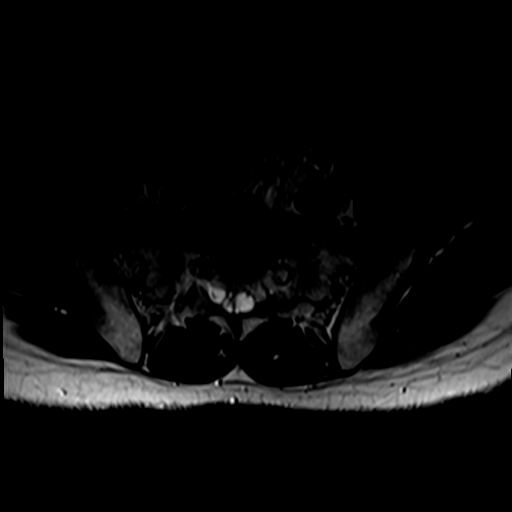
[im 7/43]
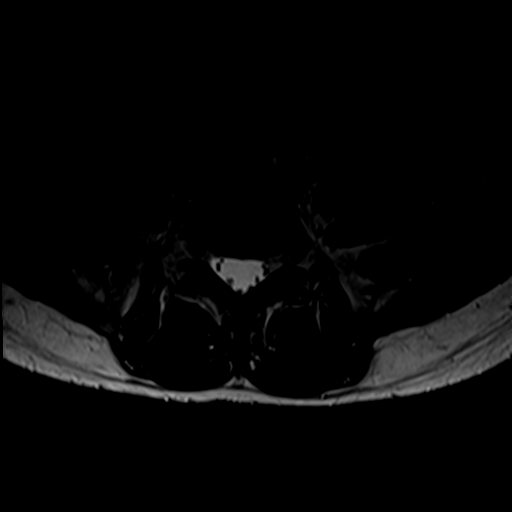
[im 13/43]
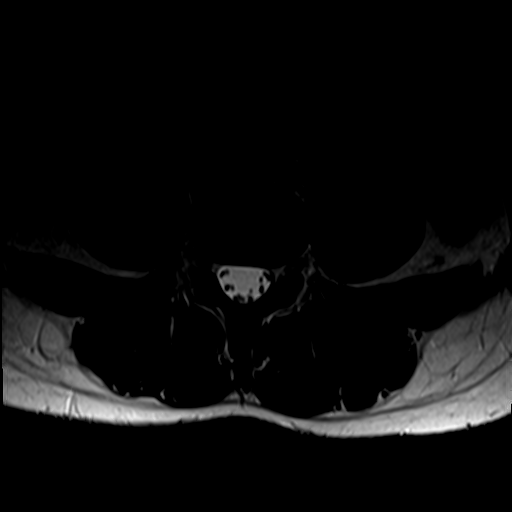
[im 19/43]
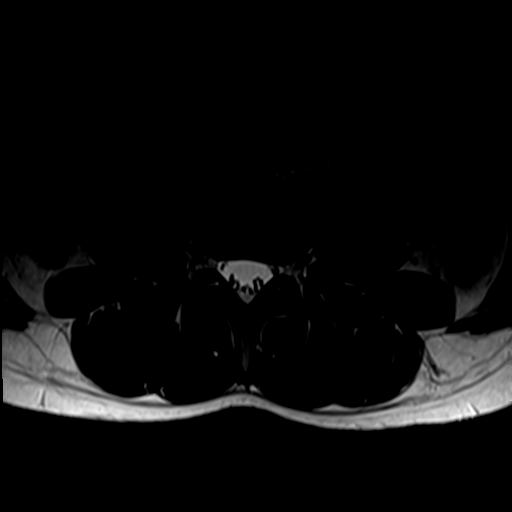
[im 22/43]
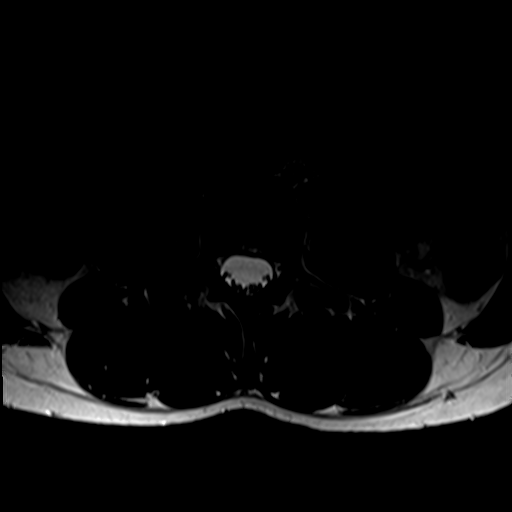
[im 25/43]
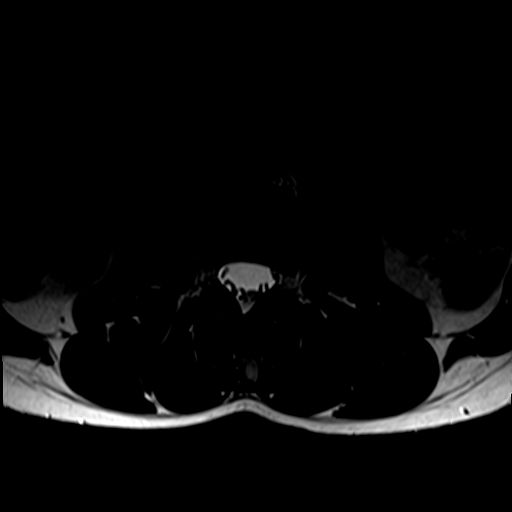
[im 31/43]
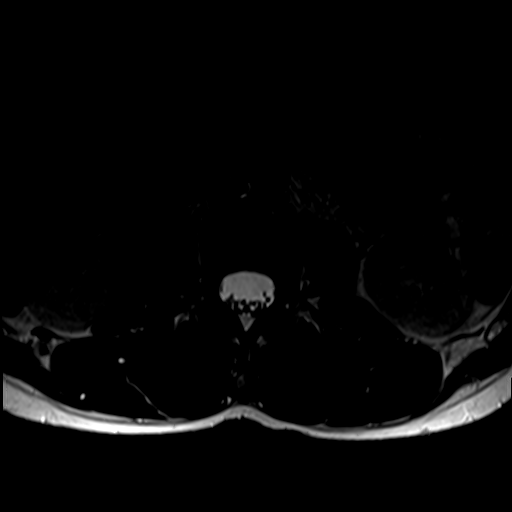
[im 37/43]
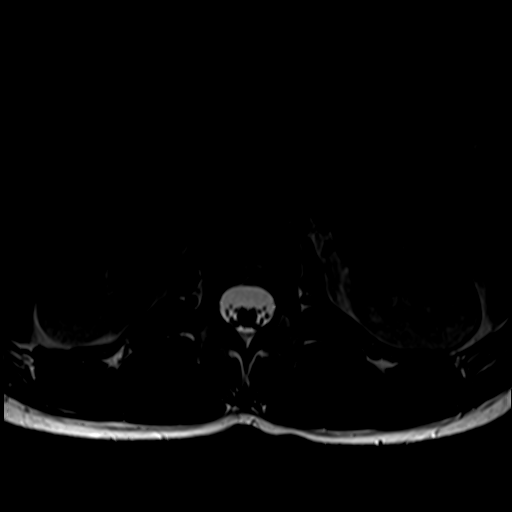
[im 43/43]
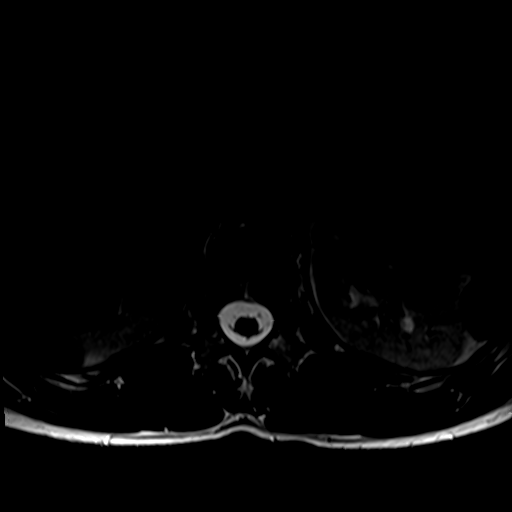

[Series 7: T1 · axial · 4.0mm · 0.39mm/px · z∈[-66,+124]mm · 6 of 43 slices shown (2 of 2)]
[im 1/43]
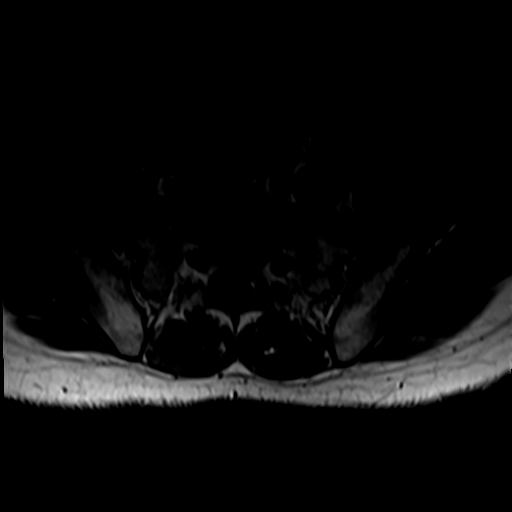
[im 7/43]
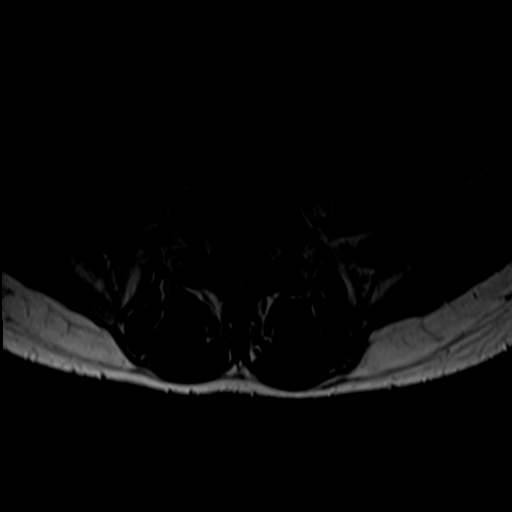
[im 13/43]
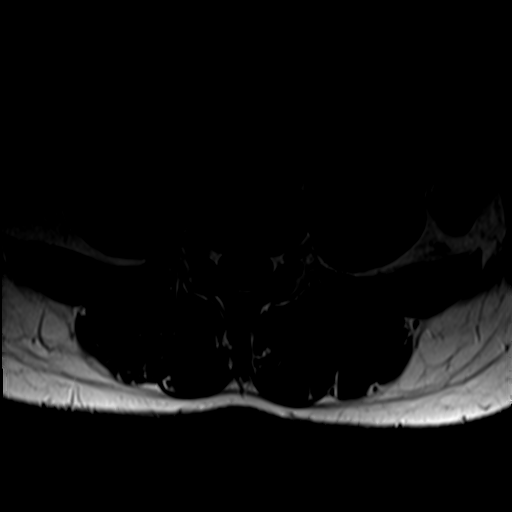
[im 19/43]
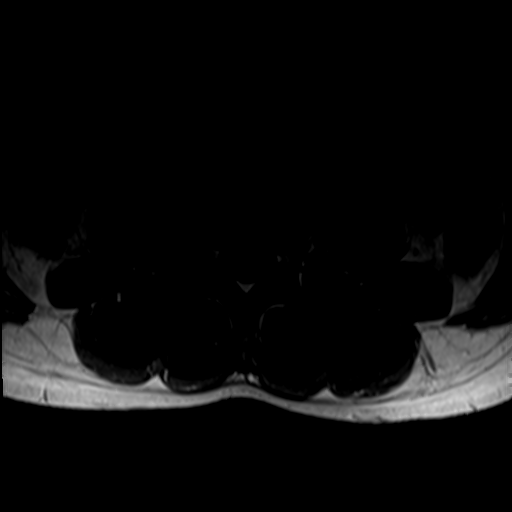
[im 22/43]
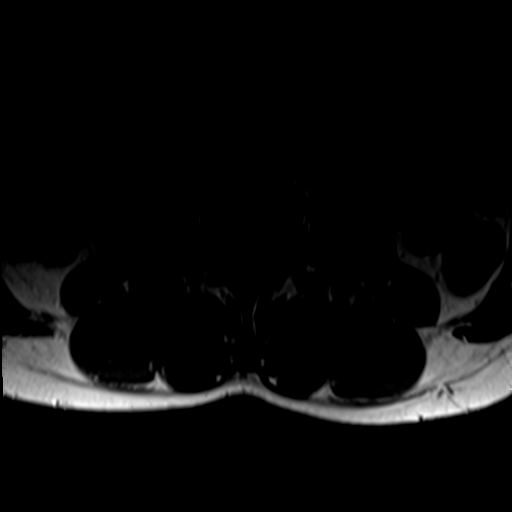
[im 37/43]
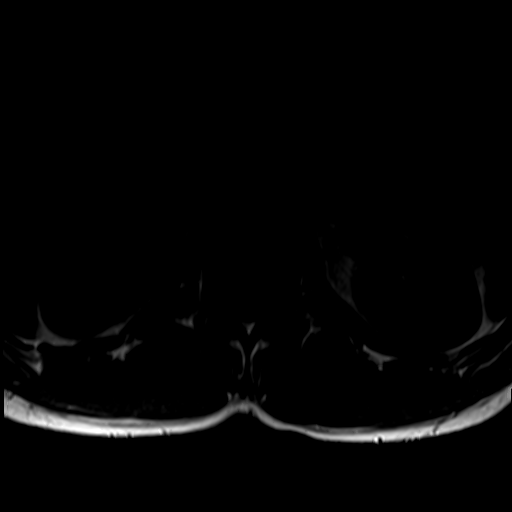

[27 of 48 positions shown; findings below may reference images not displayed]

FINDINGS: Segmentation: Standard. Lowest well-formed disc space labeled the
L5-S1 level.

Alignment: Physiologic with preservation of the normal lumbar
lordosis. No listhesis.

Vertebrae: Vertebral body height maintained without acute or chronic
fracture. Bone marrow signal intensity within normal limits. No
discrete or worrisome osseous lesions. No abnormal marrow edema.

Conus medullaris and cauda equina: Conus extends to the L1 level.
Conus and cauda equina appear normal.

Paraspinal and other soft tissues: Paraspinous soft tissues within
normal limits. Visualized visceral structures are normal.

Disc levels:

No significant disc pathology seen within the lumbar spine.
Intervertebral discs are well hydrated with preserved disc height.
No disc bulge or focal disc herniation. No significant facet
disease. No canal or neural foraminal stenosis or evidence for
neural impingement.
IMPRESSION: Normal MRI of the lumbar spine. No findings to explain patient's
symptoms identified.

## 2020-06-26 ENCOUNTER — Ambulatory Visit
Admission: RE | Admit: 2020-06-26 | Discharge: 2020-06-26 | Disposition: A | Discharge status: Home / Self Care | Payer: Managed Care, Other (non HMO) | Source: Ambulatory Visit | Attending: Family | Admitting: Family

## 2020-06-26 DIAGNOSIS — M25561 Pain in right knee: Secondary | ICD-10-CM

## 2020-06-26 DIAGNOSIS — M25562 Pain in left knee: Secondary | ICD-10-CM

## 2020-06-26 IMAGING — MR MR KNEE*R* W/O CM
4 of 7 series · 23 of 40 positions shown · non-contrast
Comparison: Radiographs [DATE]

CLINICAL DATA: Right knee pain.

EXAM:
MRI OF THE RIGHT KNEE WITHOUT CONTRAST
TECHNIQUE: Multiplanar, multisequence MR imaging of the knee was performed. No
intravenous contrast was administered.

[Series 2: T2 fat-sat · axial · 4.0mm · 0.50mm/px · z∈[-53,+92]mm · 7 of 30 slices shown (1 of 2)]
[im 1/30]
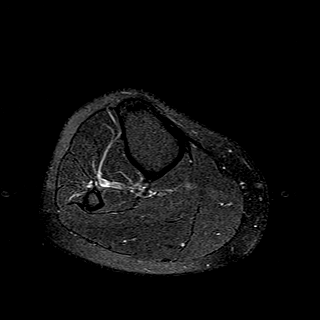
[im 5/30]
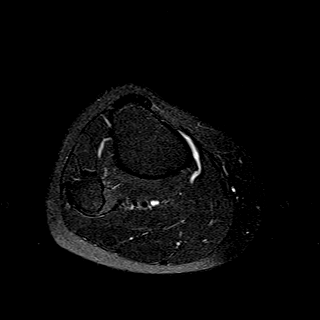
[im 10/30]
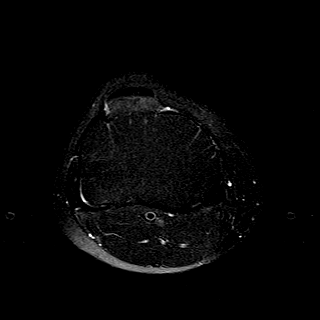
[im 15/30]
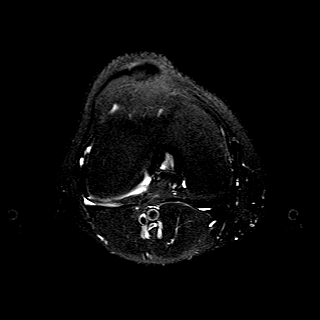
[im 20/30]
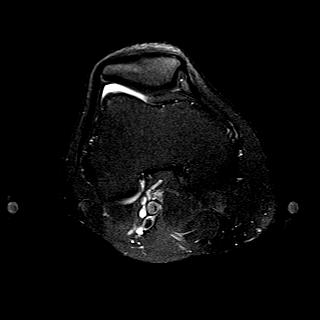
[im 25/30]
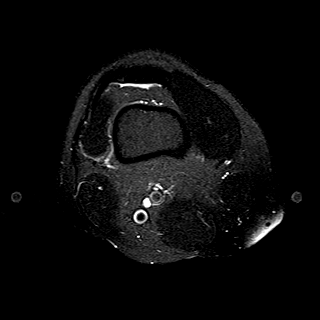
[im 30/30]
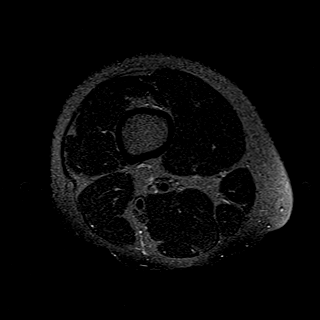

[Series 4: T2 fat-sat · coronal · 4.0mm · 0.29mm/px · 4 of 23 slices shown (2 of 2)]
[im 1/23]
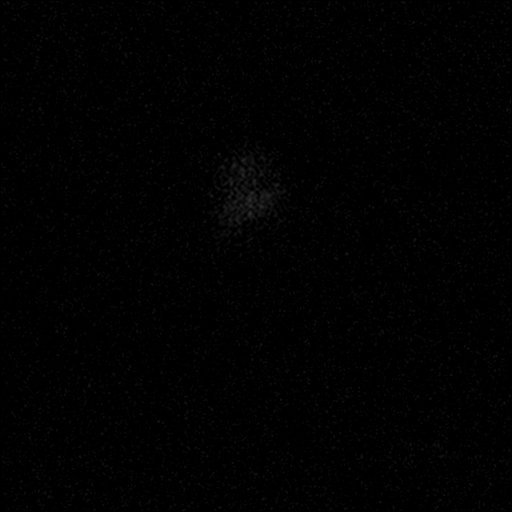
[im 6/23]
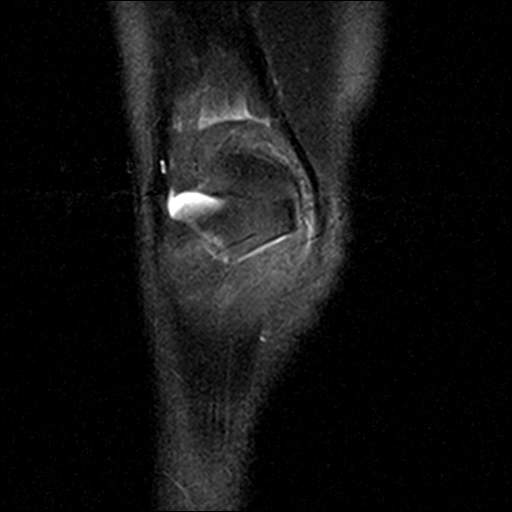
[im 12/23]
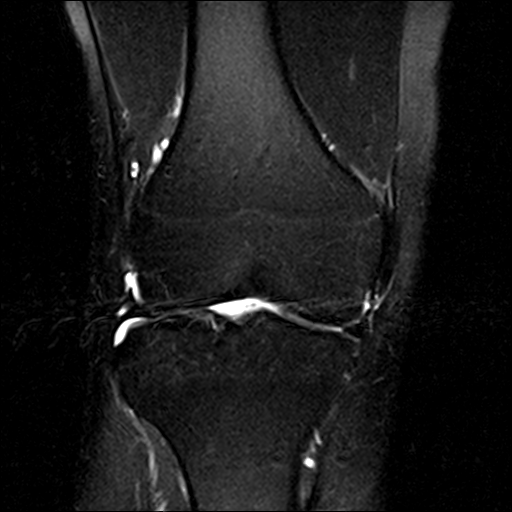
[im 23/23]
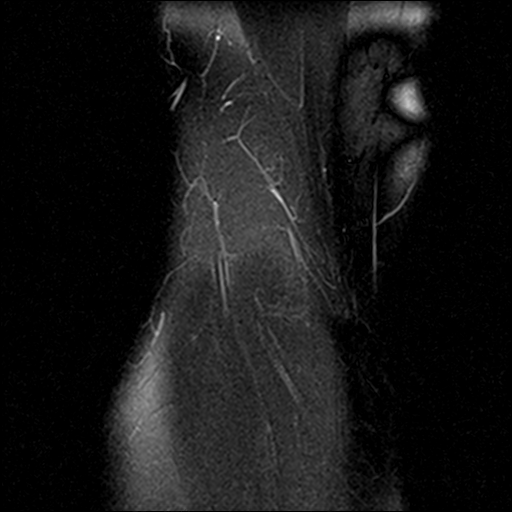

[Series 5: PD fat-sat · coronal · 3.0mm · 0.29mm/px · 6 of 28 slices shown (1 of 2)]
[im 1/28]
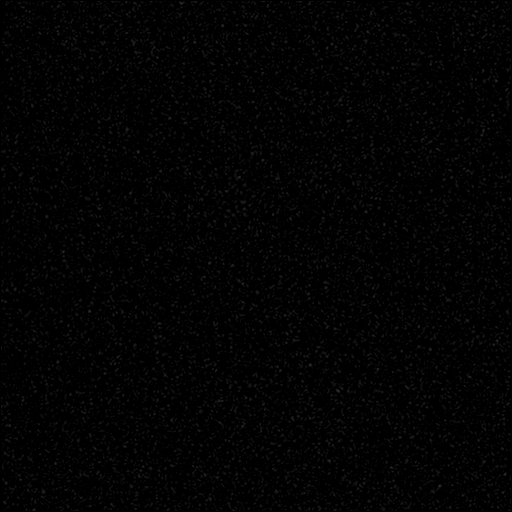
[im 6/28]
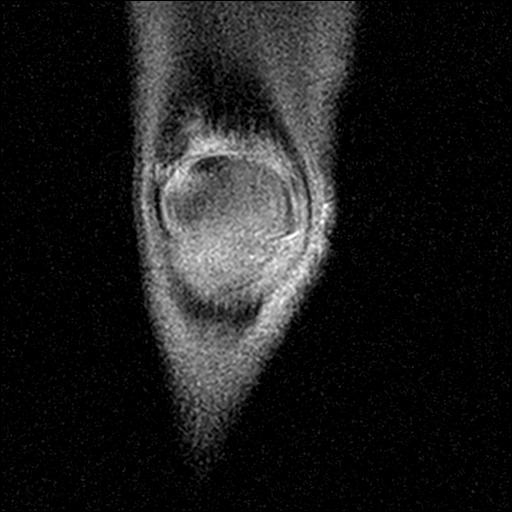
[im 11/28]
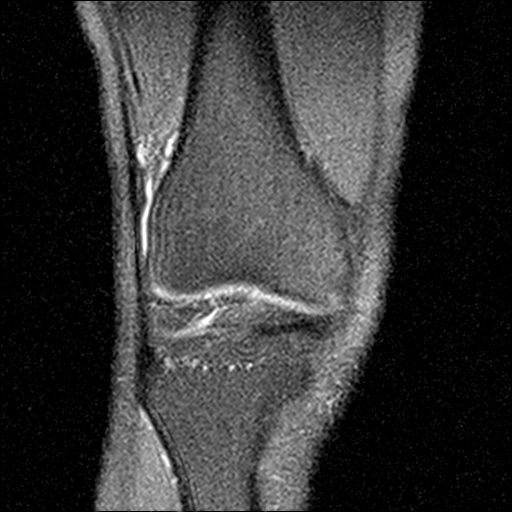
[im 17/28]
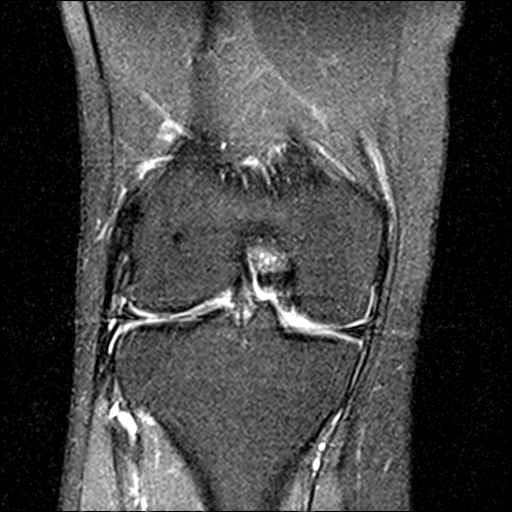
[im 22/28]
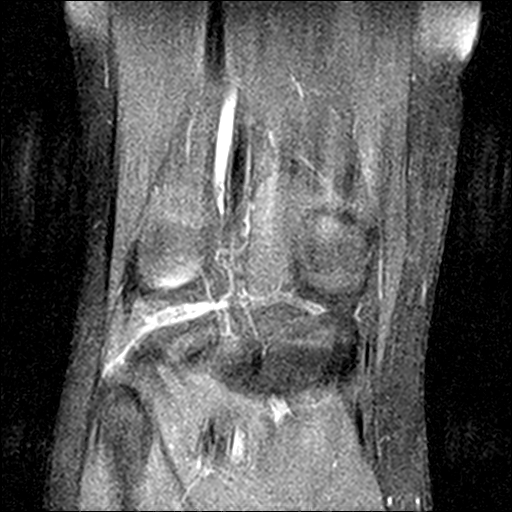
[im 28/28]
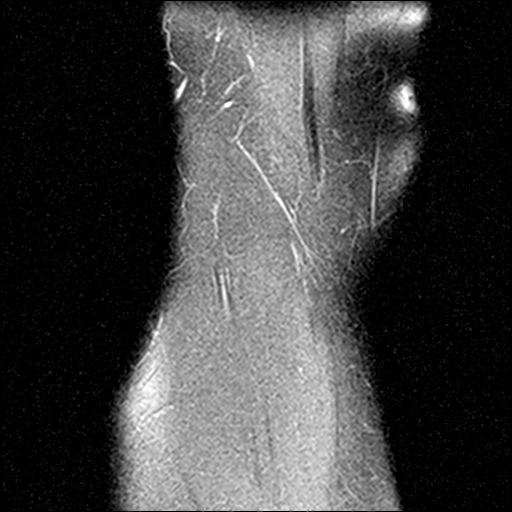

[Series 6: PD fat-sat · sagittal · 3.0mm · 0.29mm/px · 6 of 26 slices shown (2 of 2)]
[im 1/26]
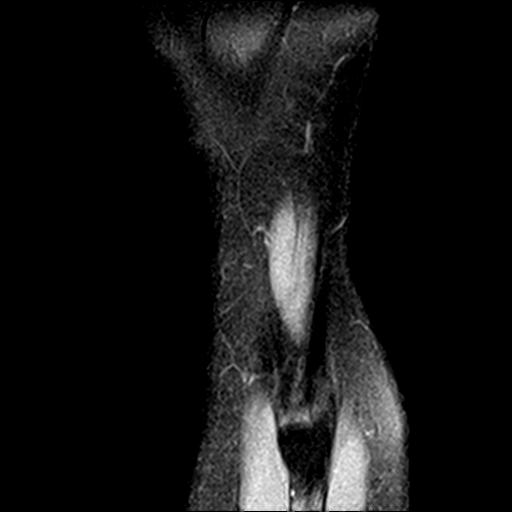
[im 6/26]
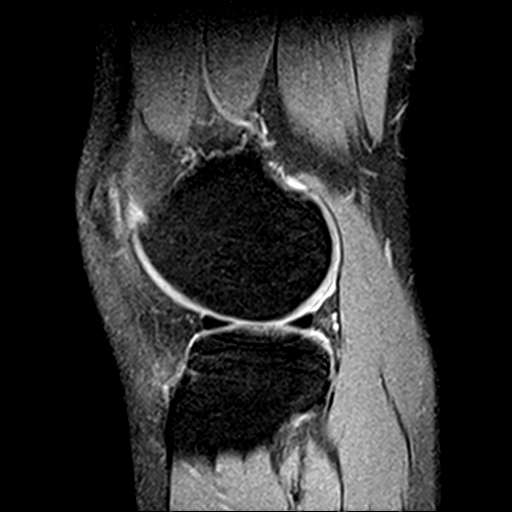
[im 11/26]
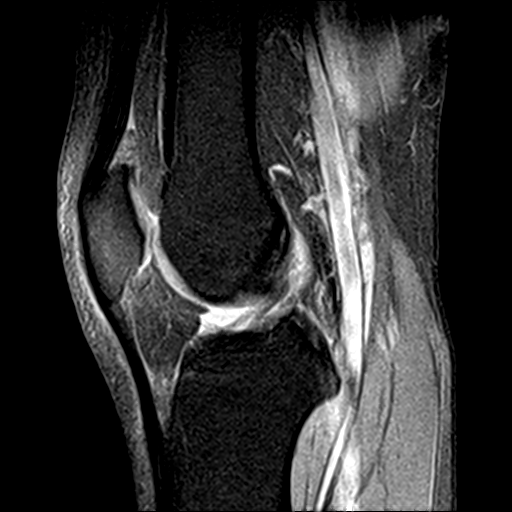
[im 16/26]
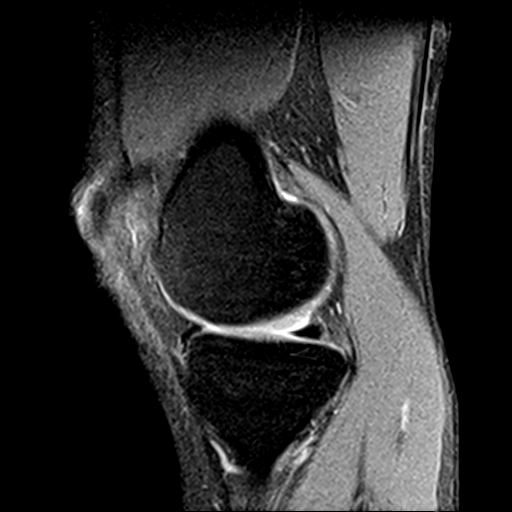
[im 21/26]
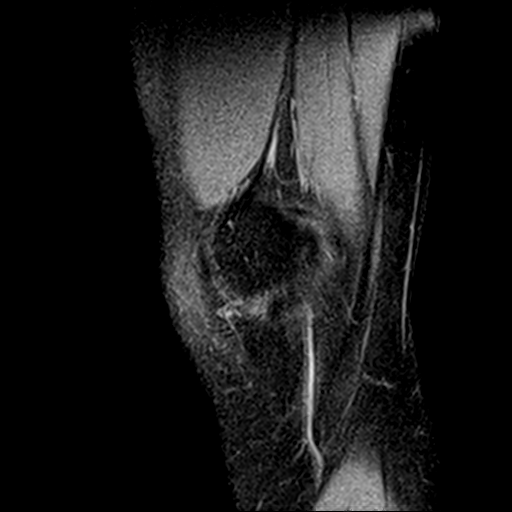
[im 26/26]
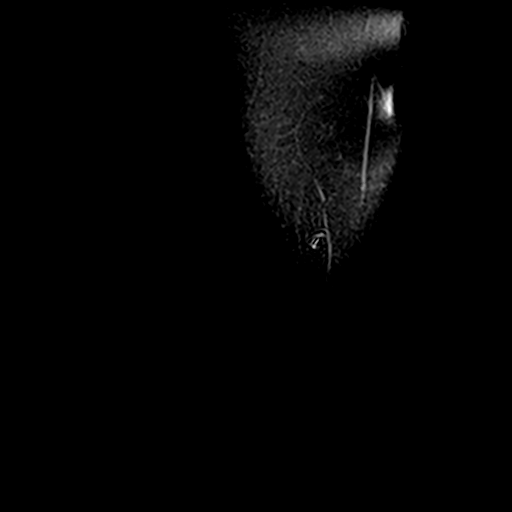

[23 of 40 positions shown; findings below may reference images not displayed]

FINDINGS: MENISCI

Medial meniscus:  Intact

Lateral meniscus:  Intact

LIGAMENTS

Cruciates:  Intact

Collaterals:  Intact

CARTILAGE

Patellofemoral:  Normal

Medial:  Normal

Lateral:  Normal

Joint: No joint effusion. Mild inflammation of the quadriceps fat
pad.

Popliteal Fossa:  No popliteal mass or Baker's cyst.

Extensor Mechanism: The patella retinacular structures are intact
and the quadriceps and patellar tendons are intact.

Bones: No acute bony findings. No bone contusions, marrow edema or
osteochondral lesion.

Other: Unremarkable knee musculature.
IMPRESSION: 1. Intact ligamentous structures and no acute bony findings.
2. No meniscal tears and intact articular cartilage.
3. No joint effusion or Baker's cyst.
4. Mild inflammation of the quadriceps fat pad.

## 2020-06-26 IMAGING — MR MR KNEE*L* W/O CM
4 of 6 series · 23 of 40 positions shown · non-contrast
Comparison: Radiographs [DATE]

CLINICAL DATA: Left knee pain.

EXAM:
MRI OF THE LEFT KNEE WITHOUT CONTRAST
TECHNIQUE: Multiplanar, multisequence MR imaging of the knee was performed. No
intravenous contrast was administered.

[Series 3: T2 fat-sat · axial · 4.0mm · 0.50mm/px · z∈[-35,+85]mm · 6 of 30 slices shown (1 of 2)]
[im 1/30]
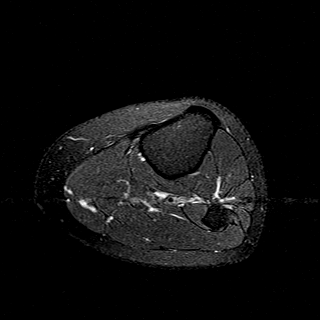
[im 5/30]
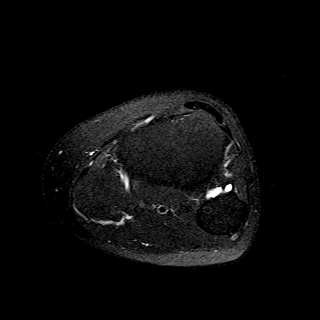
[im 10/30]
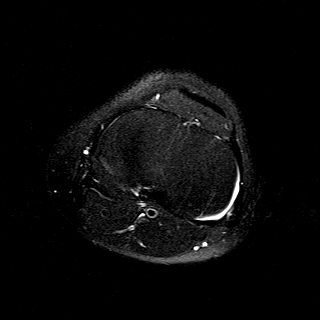
[im 15/30]
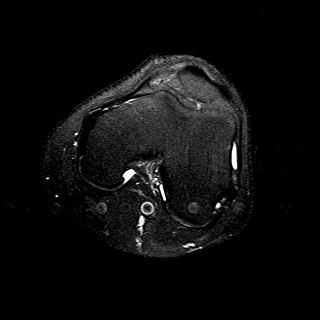
[im 20/30]
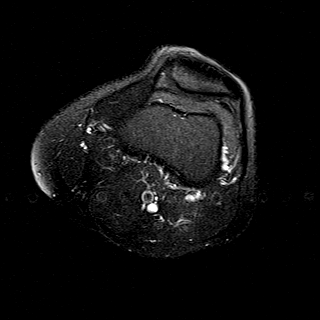
[im 25/30]
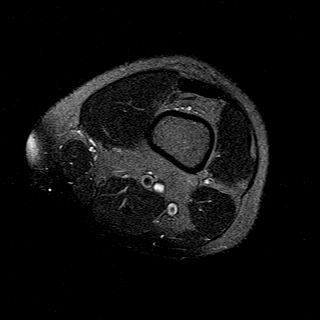

[Series 5: T2 fat-sat · coronal · 4.0mm · 0.29mm/px · 3 of 22 slices shown (2 of 2)]
[im 5/22]
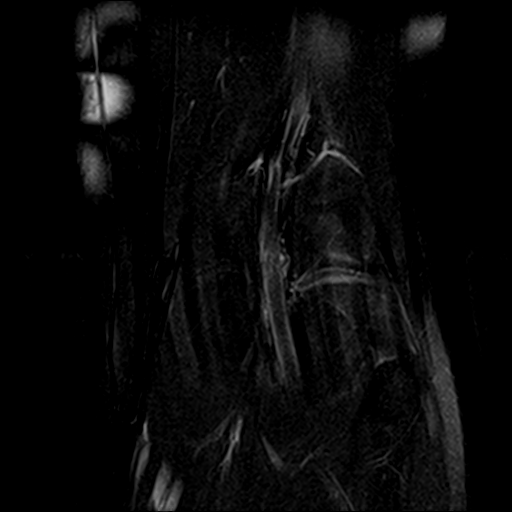
[im 13/22]
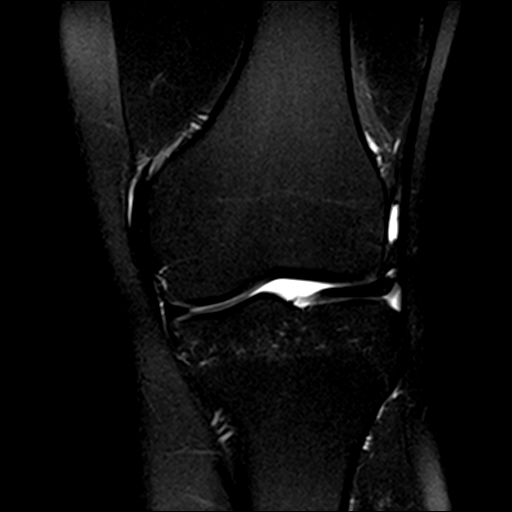
[im 22/22]
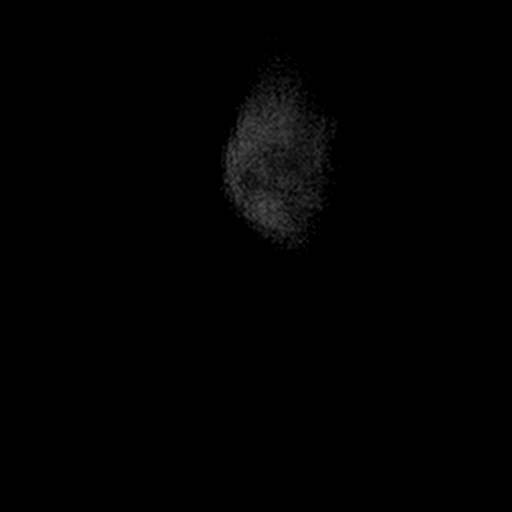

[Series 7: PD fat-sat · sagittal · 3.0mm · 0.29mm/px · 7 of 26 slices shown (1 of 2)]
[im 1/26]
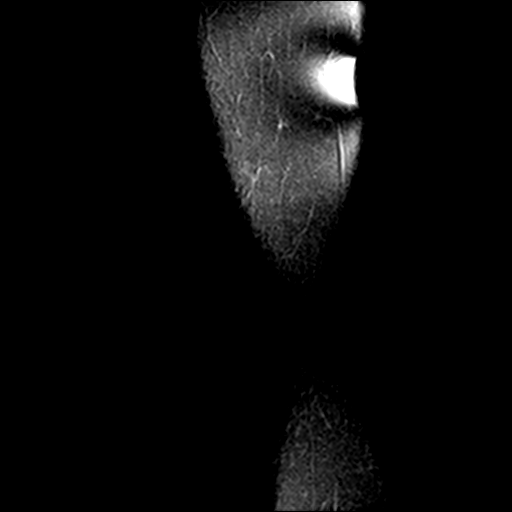
[im 5/26]
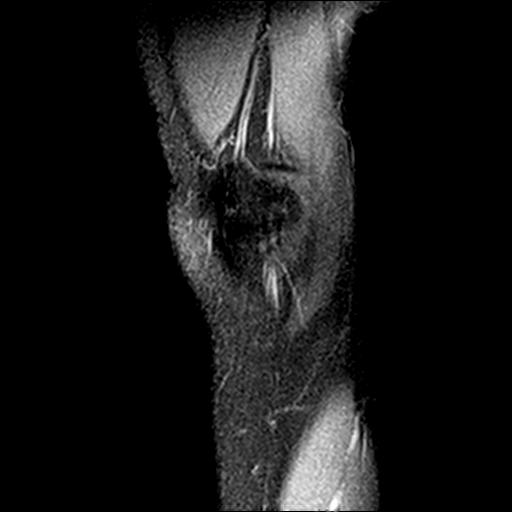
[im 9/26]
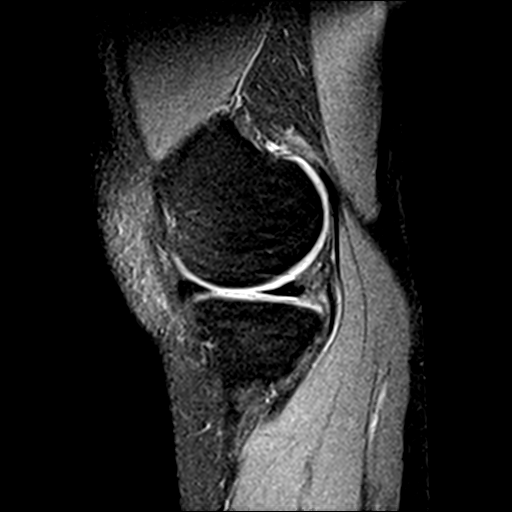
[im 13/26]
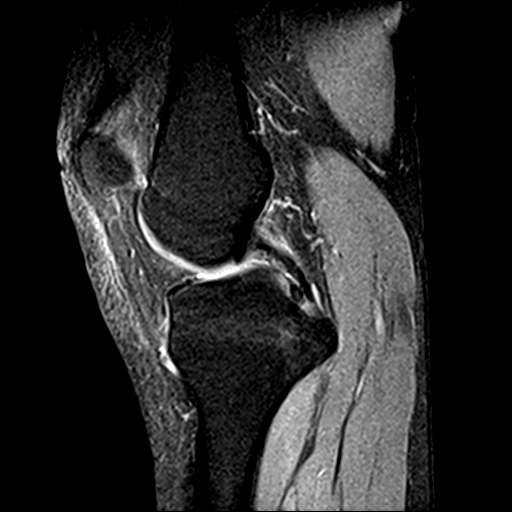
[im 17/26]
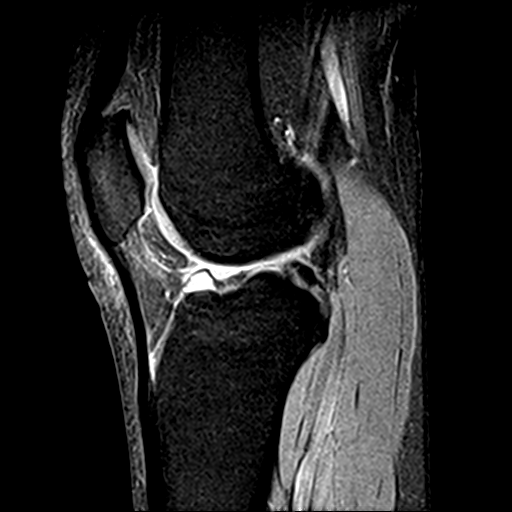
[im 21/26]
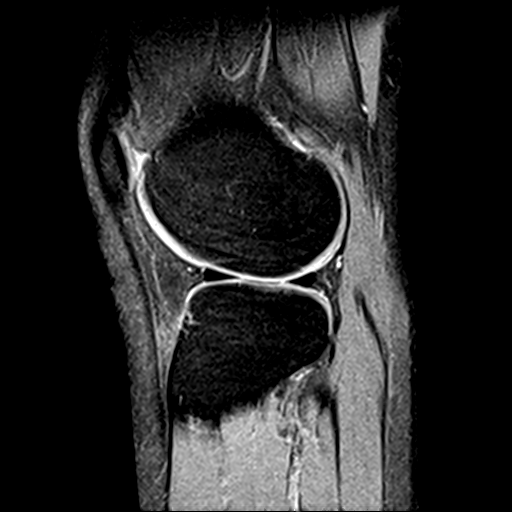
[im 26/26]
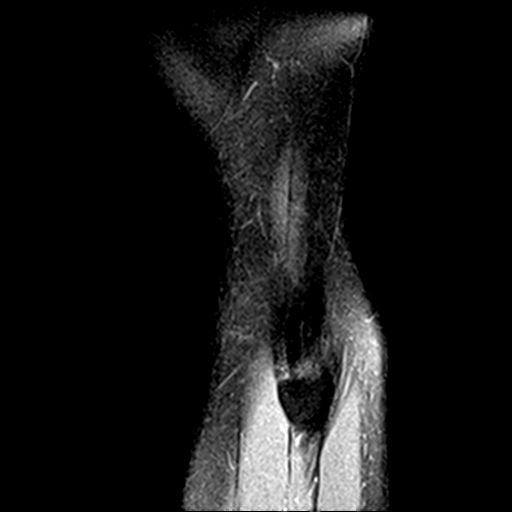

[Series 9: PD fat-sat · coronal · 3.0mm · 0.29mm/px · 7 of 28 slices shown (2 of 2)]
[im 1/28]
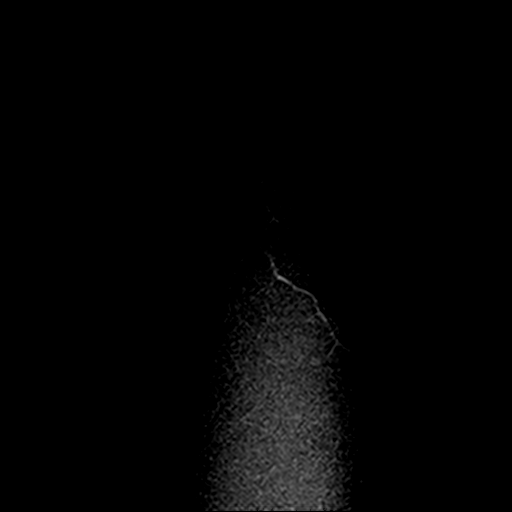
[im 5/28]
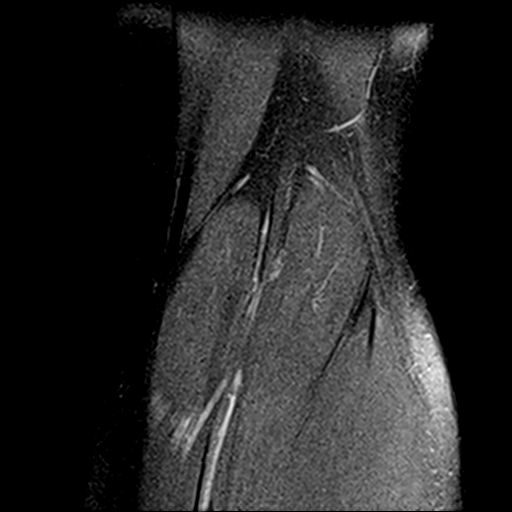
[im 10/28]
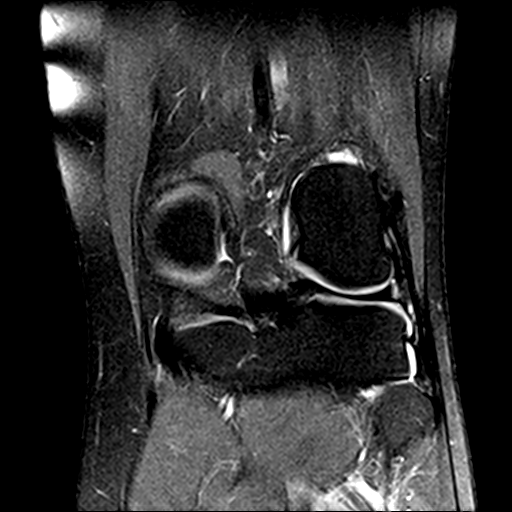
[im 14/28]
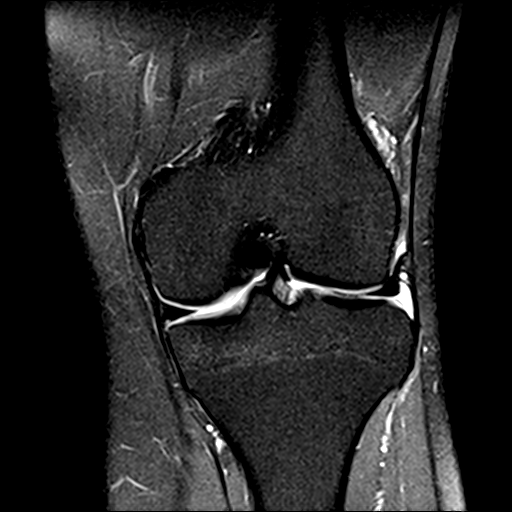
[im 19/28]
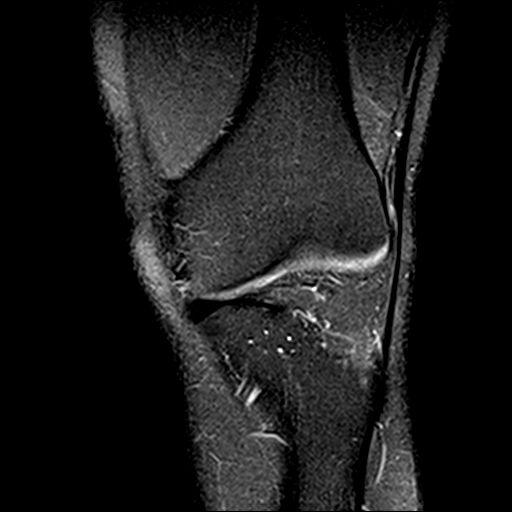
[im 23/28]
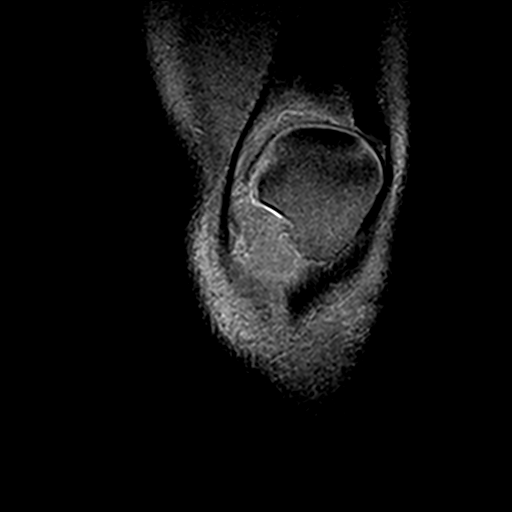
[im 28/28]
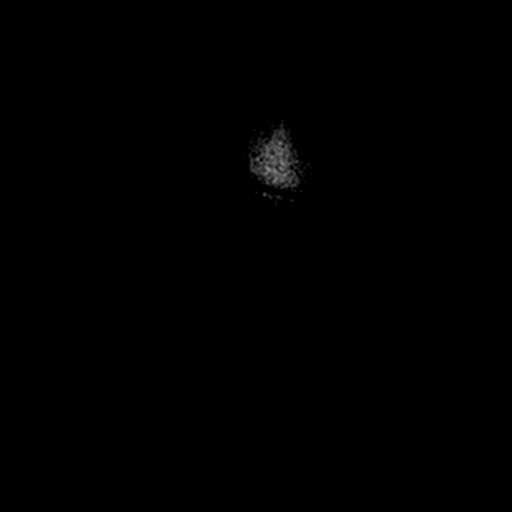

[23 of 40 positions shown; findings below may reference images not displayed]

FINDINGS: MENISCI

Medial meniscus:  Intact

Lateral meniscus:  Intact

LIGAMENTS

Cruciates:  Intact

Collaterals:  Intact

CARTILAGE

Patellofemoral:  Normal

Medial:  Normal

Lateral:  Normal

Joint:  No joint effusion.

Popliteal Fossa: No popliteal mass or Baker's cyst. Small ganglion
cyst near the tibiofibular joint.

Extensor Mechanism: The patella retinacular structures are intact
and the quadriceps and patellar tendons are intact.

Bones: No acute bony findings. No bone lesions or osteochondral
abnormality.

Other: Unremarkable knee musculature.
IMPRESSION: 1. Intact ligamentous structures and no acute bony findings.
2. No meniscal tears and intact articular cartilage.
3. No joint effusion or Baker's cyst.
